# Patient Record
Sex: Female | Born: 1980 | Race: White | Hispanic: No | Marital: Married | Smoking: Former smoker
Health system: Southern US, Community
[De-identification: ages and names within clinical notes are randomized; demographics above are authoritative.]

## PROBLEM LIST (undated history)

## (undated) DIAGNOSIS — G43909 Migraine, unspecified, not intractable, without status migrainosus: Secondary | ICD-10-CM

## (undated) HISTORY — PX: HERNIA REPAIR: SHX51

---

## 2016-01-31 ENCOUNTER — Emergency Department (HOSPITAL_COMMUNITY): Payer: Medicare Other

## 2016-01-31 ENCOUNTER — Encounter (HOSPITAL_COMMUNITY): Payer: Self-pay | Admitting: *Deleted

## 2016-01-31 ENCOUNTER — Emergency Department (HOSPITAL_COMMUNITY)
Admission: EM | Admit: 2016-01-31 | Discharge: 2016-01-31 | Disposition: A | Discharge status: Home / Self Care | Payer: Medicare Other | Attending: Emergency Medicine | Admitting: Emergency Medicine

## 2016-01-31 DIAGNOSIS — M545 Low back pain, unspecified: Secondary | ICD-10-CM

## 2016-01-31 DIAGNOSIS — Z87891 Personal history of nicotine dependence: Secondary | ICD-10-CM | POA: Insufficient documentation

## 2016-01-31 DIAGNOSIS — R109 Unspecified abdominal pain: Secondary | ICD-10-CM | POA: Diagnosis present

## 2016-01-31 DIAGNOSIS — R52 Pain, unspecified: Secondary | ICD-10-CM

## 2016-01-31 HISTORY — DX: Migraine, unspecified, not intractable, without status migrainosus: G43.909

## 2016-01-31 LAB — URINALYSIS, ROUTINE W REFLEX MICROSCOPIC
BILIRUBIN URINE: NEGATIVE
Glucose, UA: NEGATIVE mg/dL
Hgb urine dipstick: NEGATIVE
Ketones, ur: NEGATIVE mg/dL
NITRITE: NEGATIVE
PROTEIN: NEGATIVE mg/dL
SPECIFIC GRAVITY, URINE: 1.025 (ref 1.005–1.030)
pH: 6 (ref 5.0–8.0)

## 2016-01-31 LAB — URINE MICROSCOPIC-ADD ON: RBC / HPF: NONE SEEN RBC/hpf (ref 0–5)

## 2016-01-31 LAB — PREGNANCY, URINE: PREG TEST UR: NEGATIVE

## 2016-01-31 MED ORDER — HYDROCODONE-ACETAMINOPHEN 5-325 MG PO TABS: 2.0000 | ORAL_TABLET | ORAL | 0 refills | Status: AC | PRN

## 2016-01-31 MED ORDER — METHOCARBAMOL 500 MG PO TABS: 500.0000 mg | ORAL_TABLET | Freq: Two times a day (BID) | ORAL | 0 refills | Status: AC

## 2016-01-31 MED ORDER — HYDROCODONE-ACETAMINOPHEN 5-325 MG PO TABS
2.0000 | ORAL_TABLET | Freq: Once | ORAL | Status: AC
  Administered 2016-01-31: 2 via ORAL
  Filled 2016-01-31: qty 2

## 2016-01-31 MED ORDER — PREDNISONE 10 MG PO TABS: ORAL_TABLET | ORAL | 0 refills | Status: AC

## 2016-01-31 NOTE — ED Triage Notes (Signed)
Pt comes in with bilateral flank pain starting 1 week ago. Pt states she has painful urination but bought AZO and it has helped. Pt has n/v; denies diarrhea. NAD noted. Denies fevers.

## 2016-01-31 NOTE — Discharge Instructions (Signed)
See your Physician for recheck next week  °

## 2016-02-01 NOTE — ED Provider Notes (Signed)
[facility]Y DEPT Provider Note   CSN: [id number] Arrival date & time: 01/31/16  1712     History   Chief Complaint Chief Complaint  Patient presents with  . Flank Pain    HPI Rachel Daniels is a 35 y.o. female.  The history is provided by the patient. No language interpreter was used.  Flank Pain  This is a new problem. The current episode started more than 1 week ago. The problem occurs constantly. The problem has been gradually worsening. Nothing aggravates the symptoms. Nothing relieves the symptoms. She has tried nothing for the symptoms. The treatment provided no relief.  Pt complains of low back pain for 1 week.  Pt reports she had urinary burning a week ago.  Pt took azo last week and now she feels better.  Past Medical History:  Diagnosis Date  . Migraines     There are no active problems to display for this patient.   Past Surgical History:  Procedure Laterality Date  . HERNIA REPAIR      OB History    No data available       Home Medications    Prior to Admission medications   Medication Sig Start Date End Date Taking? Authorizing Provider  HYDROcodone-acetaminophen (NORCO/VICODIN) 5-325 MG tablet Take 2 tablets by mouth every 4 (four) hours as needed. 01/31/16   Elson AreasLeslie K Cowen Pesqueira, PA-C  methocarbamol (ROBAXIN) 500 MG tablet Take 1 tablet (500 mg total) by mouth 2 (two) times daily. 01/31/16   Elson AreasLeslie K Leasha Goldberger, PA-C  predniSONE (DELTASONE) 10 MG tablet 6,5,4,3,2,1 taper 01/31/16   Elson AreasLeslie K Shoua Ulloa, PA-C    Family History No family history on file.  Social History Social History  Substance Use Topics  . Smoking status: Former Games developermoker  . Smokeless tobacco: Never Used  . Alcohol use No     Allergies   Nsaids   Review of Systems Review of Systems  Genitourinary: Positive for flank pain.  All other systems reviewed and are negative.    Physical Exam Updated Vital Signs BP 130/80   Pulse 80   Temp 97.7 F (36.5 C) (Oral)   Resp 18   Ht  5\' 4"  (1.626 m)   Wt 78 kg   LMP 01/19/2016   SpO2 99%   BMI 29.52 kg/m   Physical Exam  Constitutional: She appears well-developed and well-nourished.  HENT:  Head: Normocephalic and atraumatic.  Eyes: Conjunctivae and EOM are normal. Pupils are equal, round, and reactive to light.  Neck: Normal range of motion.  Cardiovascular: Normal rate and regular rhythm.   Pulmonary/Chest: Effort normal.  Abdominal: Soft. There is no tenderness.  Musculoskeletal:  Right flank pain,  Pain with movement.   Neurological: She is alert.  Skin: Skin is warm.  Psychiatric: She has a normal mood and affect.  Nursing note and vitals reviewed.    ED Treatments / Results  Labs (all labs ordered are listed, but only abnormal results are displayed) Labs Reviewed  URINALYSIS, ROUTINE W REFLEX MICROSCOPIC (NOT [facility]) - Abnormal; Notable for the following:       Result Value   Leukocytes, UA TRACE (*)    All other components within normal limits  URINE MICROSCOPIC-ADD ON - Abnormal; Notable for the following:    Squamous Epithelial / LPF 6-30 (*)    Bacteria, UA FEW (*)    All other components within normal limits  PREGNANCY, URINE    EKG  EKG Interpretation None  Radiology Ct Renal Stone Study  Result Date: 01/31/2016 CLINICAL DATA:  35 y/o  F; bilateral flank pain starting 1 week ago. EXAM: CT ABDOMEN AND PELVIS WITHOUT CONTRAST TECHNIQUE: Multidetector CT imaging of the abdomen and pelvis was performed following the standard protocol without IV contrast. COMPARISON:  None. FINDINGS: Lower chest: No acute abnormality. Hepatobiliary: No focal liver abnormality is seen. No gallstones, gallbladder wall thickening, or biliary dilatation. Pancreas: Unremarkable. No pancreatic ductal dilatation or surrounding inflammatory changes. Spleen: Normal in size without focal abnormality. Adrenals/Urinary Tract: Adrenal glands are unremarkable. Kidneys are normal, without renal calculi, focal  lesion, or hydronephrosis. Bladder is unremarkable. Stomach/Bowel: Stomach is within normal limits. Appendix appears normal. No evidence of bowel wall thickening, distention, or inflammatory changes. Scattered colonic diverticulosis without evidence for diverticulitis. Vascular/Lymphatic: No significant vascular findings are present. No enlarged abdominal or pelvic lymph nodes. Reproductive: Ovarian simple appearing cyst measuring up to 34 mm on the left. Normal uterus. Other: Moderate sized paraumbilical hernia containing fat. Small right inguinal hernia containing fat. No ascites. Musculoskeletal: No acute or significant osseous findings. IMPRESSION: 1. No acute process identified. No hydronephrosis or urinary stone disease. 2. Scattered colonic diverticulosis without evidence for diverticulitis. Electronically Signed   By: Mitzi HansenLance  Furusawa-Stratton M.D.   On: 01/31/2016 22:29    Procedures Procedures (including critical care time)  Medications Ordered in ED Medications  HYDROcodone-acetaminophen (NORCO/VICODIN) 5-325 MG per tablet 2 tablet (2 tablets Oral Given 01/31/16 1939)     Initial Impression / Assessment and Plan / ED Course  I have reviewed the triage vital signs and the nursing notes.  Pertinent labs & imaging results that were available during my care of the patient were reviewed by me and considered in my medical decision making (see chart for details).  Clinical Course     No renal stone.  Pt advised of diverticulosis.   Pt given rxs  I advised rest.  Follow up with primary care for recheck.  Final Clinical Impressions(s) / ED Diagnoses   Final diagnoses:  Acute bilateral low back pain without sciatica    New Prescriptions Discharge Medication List as of 01/31/2016 10:42 PM    START taking these medications   Details  HYDROcodone-acetaminophen (NORCO/VICODIN) 5-325 MG tablet Take 2 tablets by mouth every 4 (four) hours as needed., Starting Fri 01/31/2016, Print      methocarbamol (ROBAXIN) 500 MG tablet Take 1 tablet (500 mg total) by mouth 2 (two) times daily., Starting Fri 01/31/2016, Print    predniSONE (DELTASONE) 10 MG tablet 6,5,4,3,2,1 taper, Print      An After Visit Summary was printed and given to the patient.   Lonia SkinnerLeslie K ManningSofia, PA-C 02/01/16 [id number]    Pricilla LovelessScott Goldston, MD 02/06/16 [phone]

## 2017-07-26 IMAGING — CT CT RENAL STONE PROTOCOL
2 of 4 series · 16 of 46 positions shown, 18 images · non-contrast
Comparison: None.

CLINICAL DATA: 35 y/o  F; bilateral flank pain starting 1 week ago.

EXAM:
CT ABDOMEN AND PELVIS WITHOUT CONTRAST
TECHNIQUE: Multidetector CT imaging of the abdomen and pelvis was performed
following the standard protocol without IV contrast.

[Series 2: axial st · axial · 0.75mm/px · z∈[-566,-156]mm · 13 of 90 slices shown, 15 images]
[im 4/90  soft-tissue]
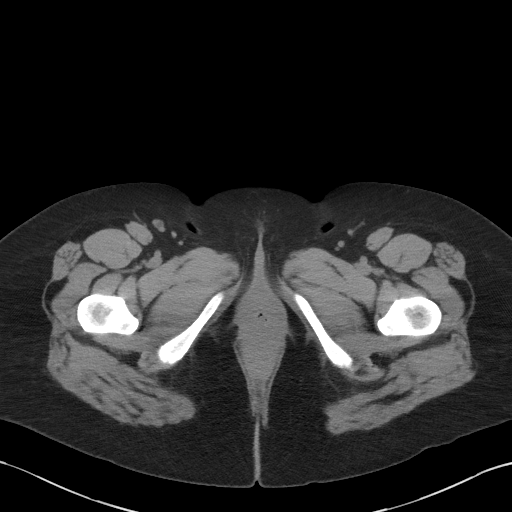
[im 4/90  bone]
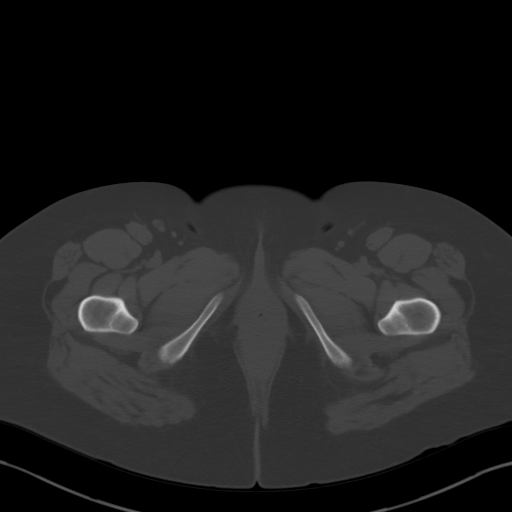
[im 12/90  soft-tissue]
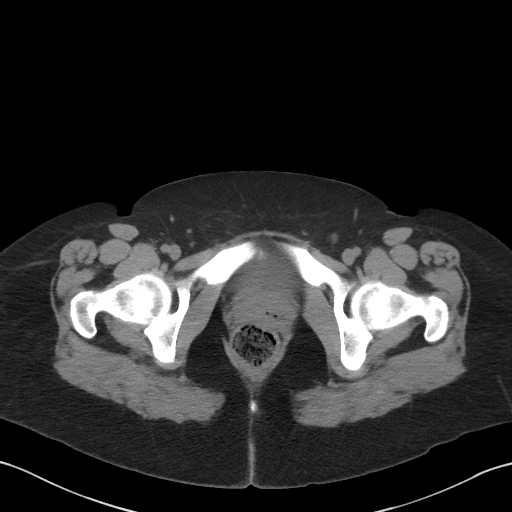
[im 20/90  soft-tissue]
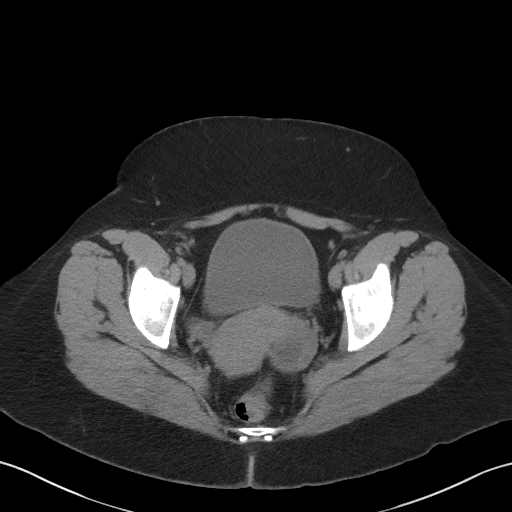
[im 24/90  soft-tissue]
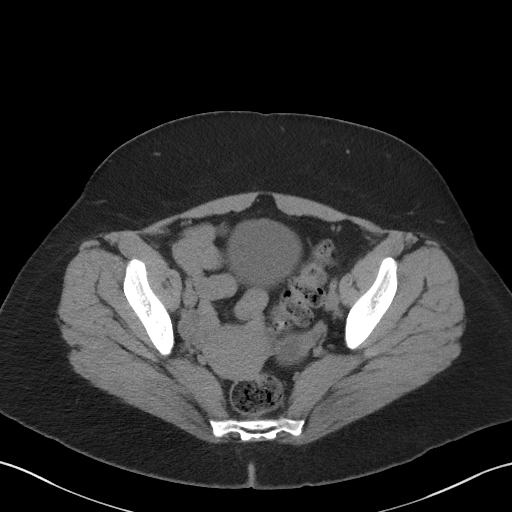
[im 31/90  soft-tissue]
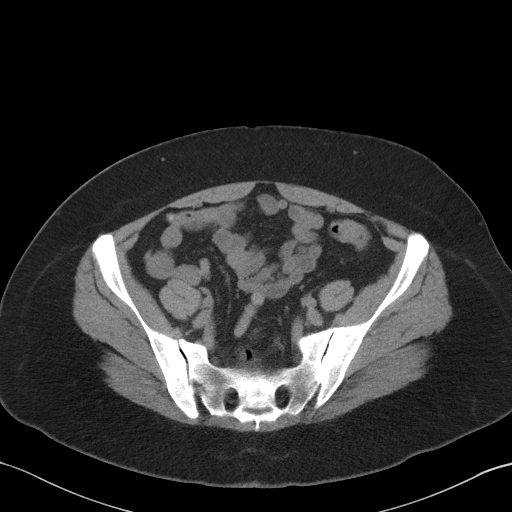
[im 39/90  soft-tissue]
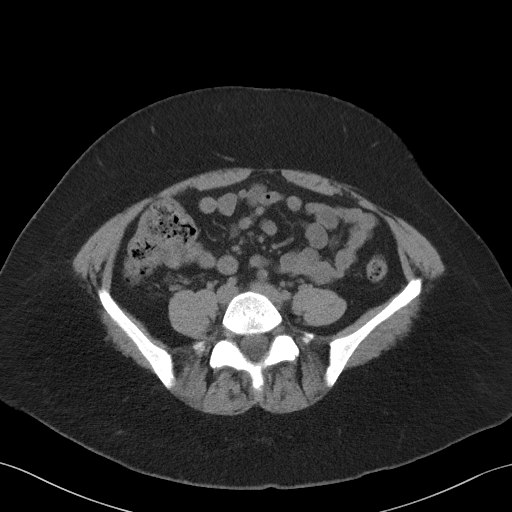
[im 47/90  soft-tissue]
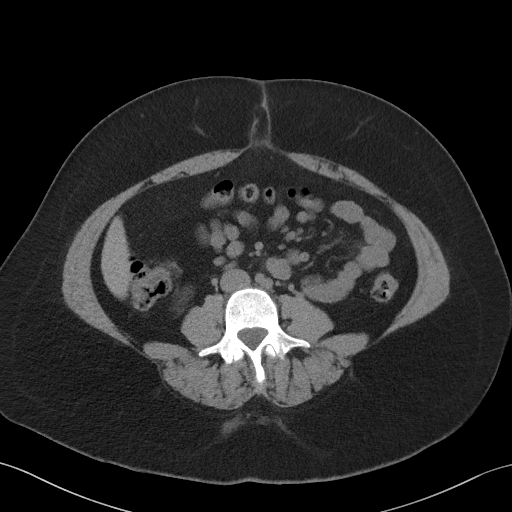
[im 51/90  soft-tissue]
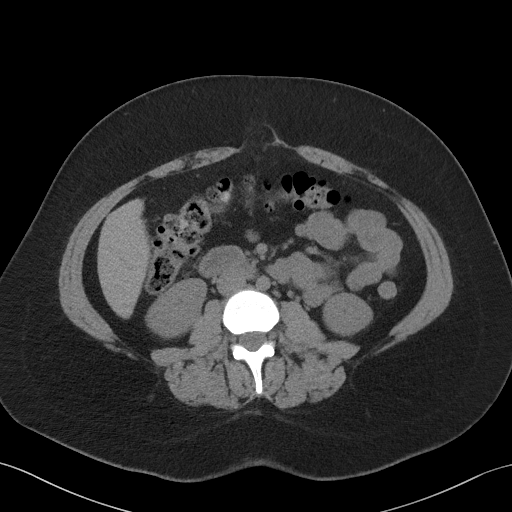
[im 59/90  soft-tissue]
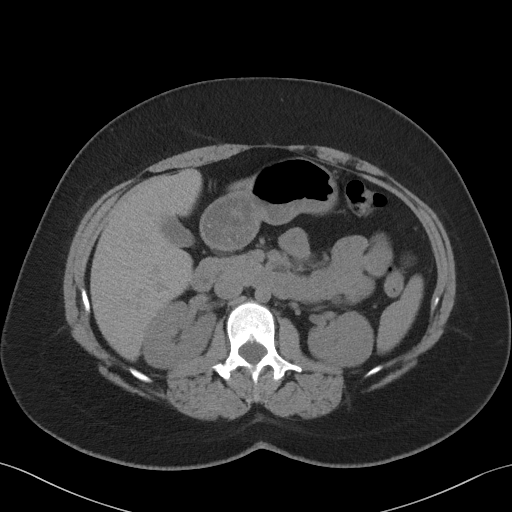
[im 59/90  bone]
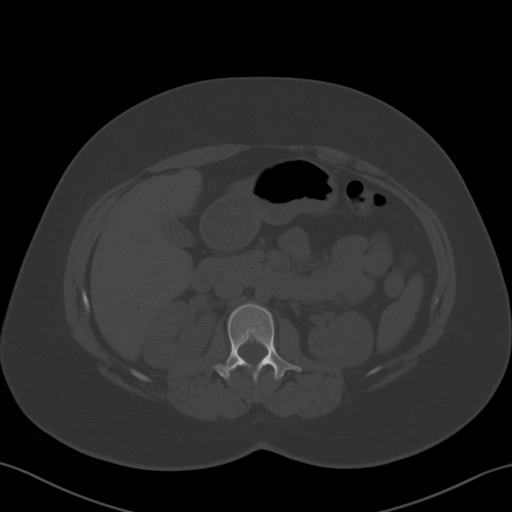
[im 66/90  soft-tissue]
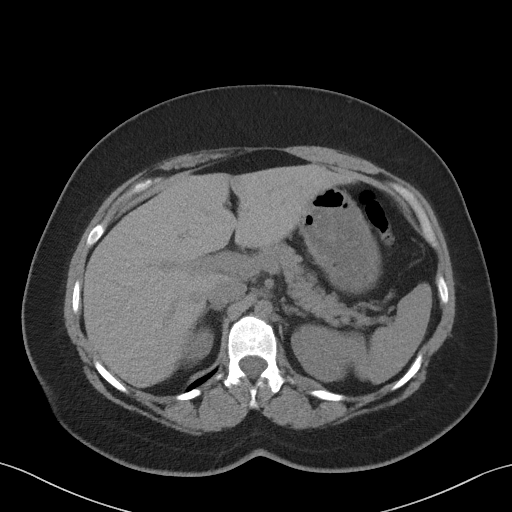
[im 70/90  soft-tissue]
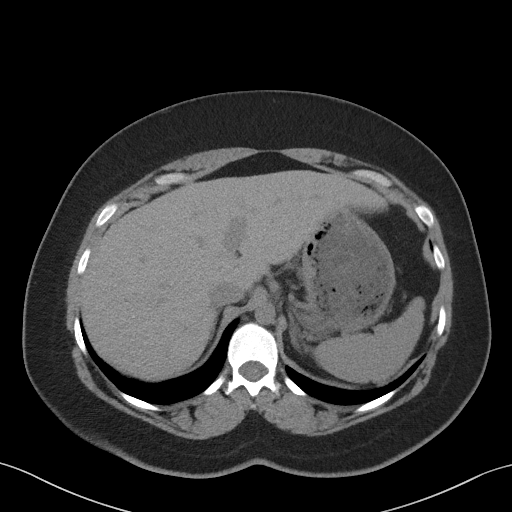
[im 78/90  soft-tissue]
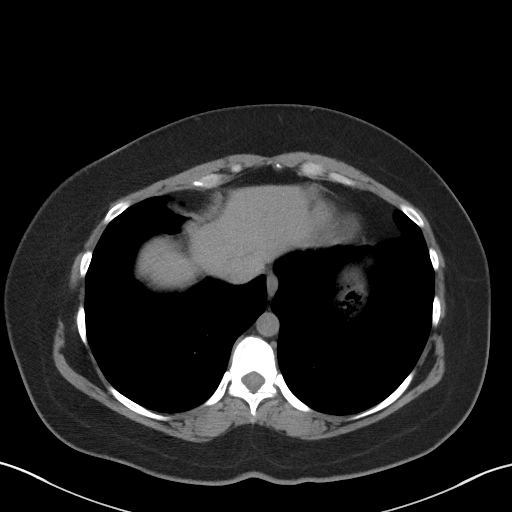
[im 86/90  soft-tissue]
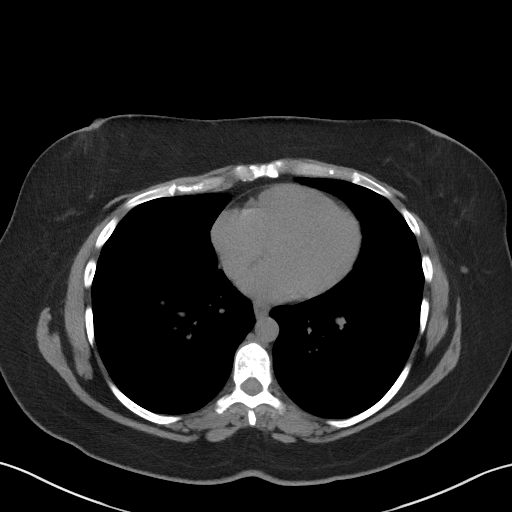

[Series 3: coronal st · coronal · 0.80mm/px · 3 of 117 slices shown]
[im 39/117  soft-tissue]
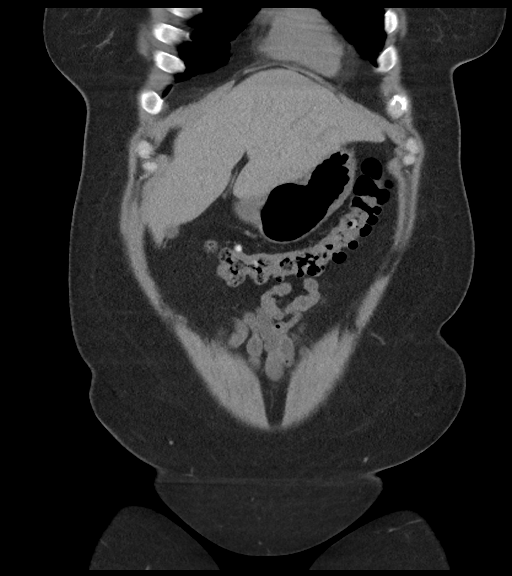
[im 52/117  soft-tissue]
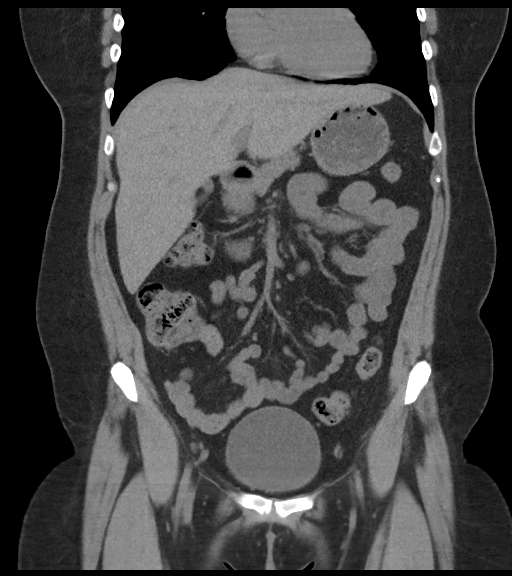
[im 65/117  soft-tissue]
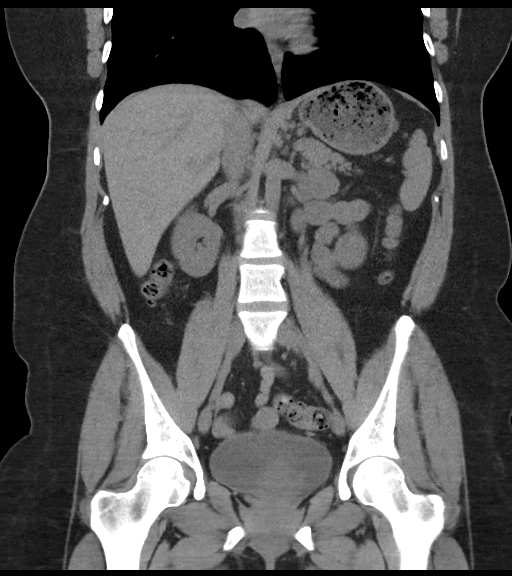

[16 of 46 positions shown; findings below may reference images not displayed]

FINDINGS: Lower chest: No acute abnormality.

Hepatobiliary: No focal liver abnormality is seen. No gallstones,
gallbladder wall thickening, or biliary dilatation.

Pancreas: Unremarkable. No pancreatic ductal dilatation or
surrounding inflammatory changes.

Spleen: Normal in size without focal abnormality.

Adrenals/Urinary Tract: Adrenal glands are unremarkable. Kidneys are
normal, without renal calculi, focal lesion, or hydronephrosis.
Bladder is unremarkable.

Stomach/Bowel: Stomach is within normal limits. Appendix appears
normal. No evidence of bowel wall thickening, distention, or
inflammatory changes. Scattered colonic diverticulosis without
evidence for diverticulitis.

Vascular/Lymphatic: No significant vascular findings are present. No
enlarged abdominal or pelvic lymph nodes.

Reproductive: Ovarian simple appearing cyst measuring up to 34 mm on
the left. Normal uterus.

Other: Moderate sized paraumbilical hernia containing fat. Small
right inguinal hernia containing fat. No ascites.

Musculoskeletal: No acute or significant osseous findings.
IMPRESSION: 1. No acute process identified. No hydronephrosis or urinary stone
disease.
2. Scattered colonic diverticulosis without evidence for
diverticulitis.

By: Fantenord Jamand M.D.
